# Patient Record
Sex: Male | Born: 1985 | Race: White | Hispanic: No | Marital: Single | Smoking: Never smoker
Health system: Southern US, Community
[De-identification: ages and names within clinical notes are randomized; demographics above are authoritative.]

---

## 2007-03-08 ENCOUNTER — Emergency Department (HOSPITAL_COMMUNITY): Admission: EM | Admit: 2007-03-08 | Discharge: 2007-03-09 | Payer: Self-pay | Admitting: Emergency Medicine

## 2007-03-10 ENCOUNTER — Ambulatory Visit (HOSPITAL_BASED_OUTPATIENT_CLINIC_OR_DEPARTMENT_OTHER): Admission: RE | Admit: 2007-03-10 | Discharge: 2007-03-10 | Payer: Self-pay | Admitting: Orthopedic Surgery

## 2007-04-07 ENCOUNTER — Emergency Department (HOSPITAL_COMMUNITY): Admission: EM | Admit: 2007-04-07 | Discharge: 2007-04-07 | Payer: Self-pay | Admitting: Emergency Medicine

## 2010-02-09 ENCOUNTER — Emergency Department (HOSPITAL_COMMUNITY): Admission: EM | Admit: 2010-02-09 | Discharge: 2010-02-09 | Payer: Self-pay | Admitting: Emergency Medicine

## 2010-07-16 IMAGING — CR DG SHOULDER 2+V*R*
3 series · 3 of 3 positions shown · non-contrast
Comparison: None available.

CLINICAL DATA: Shoulder injury and pain.

RIGHT SHOULDER - 2+ VIEW

[w shoulder ap internal righ]
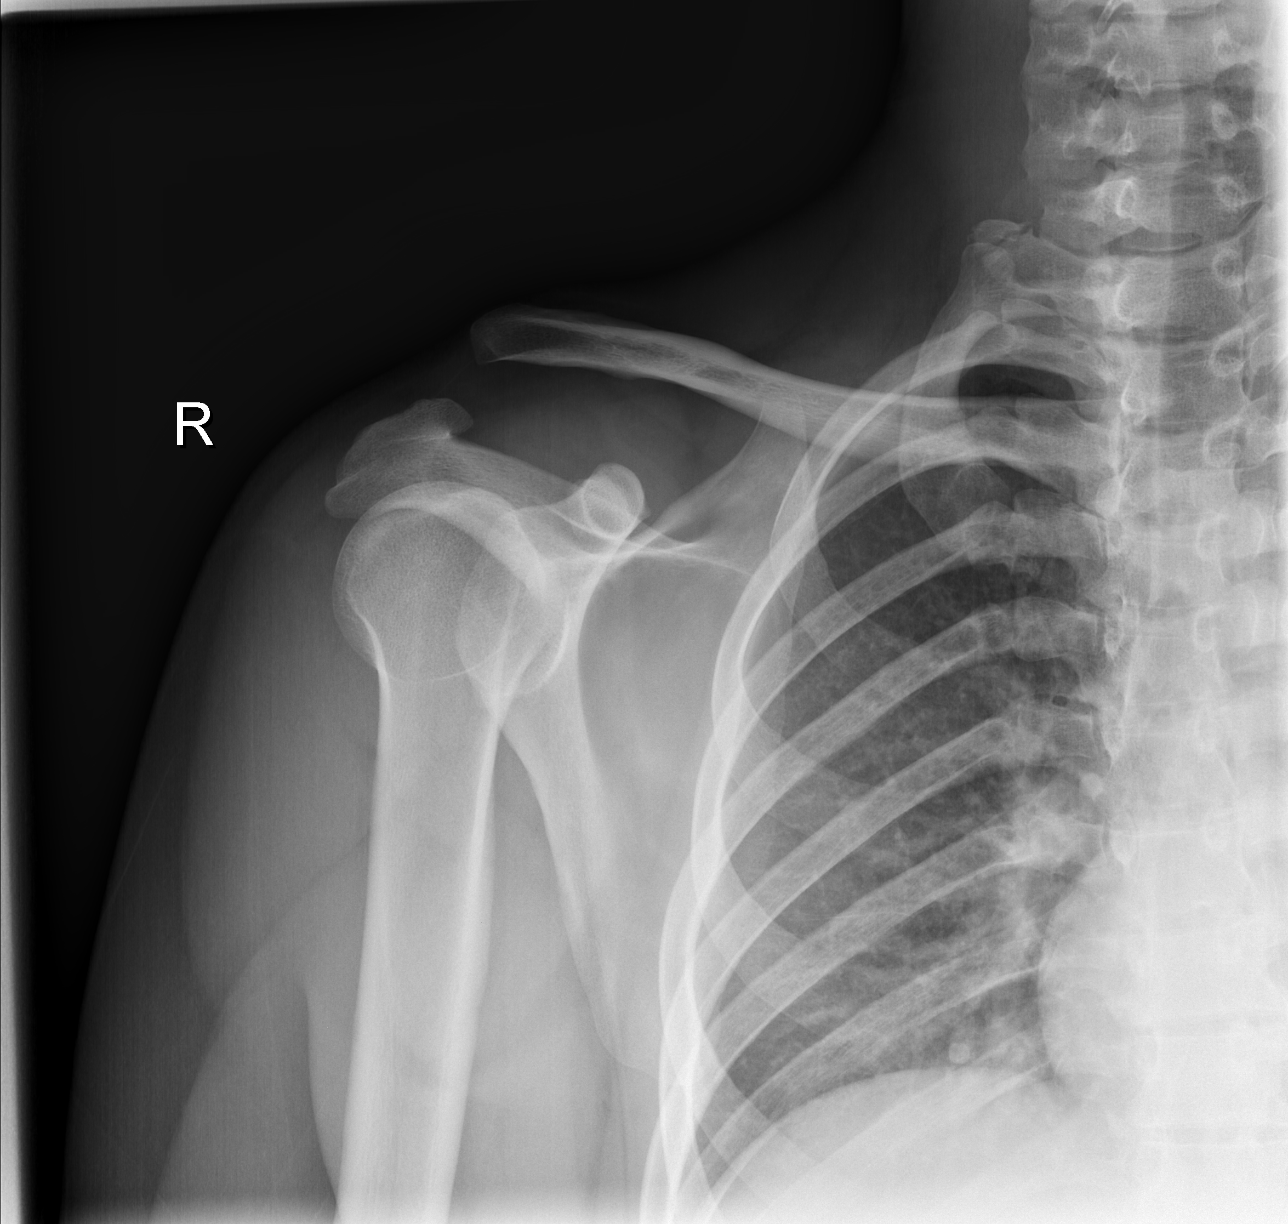

[w shoulder ap external righ]
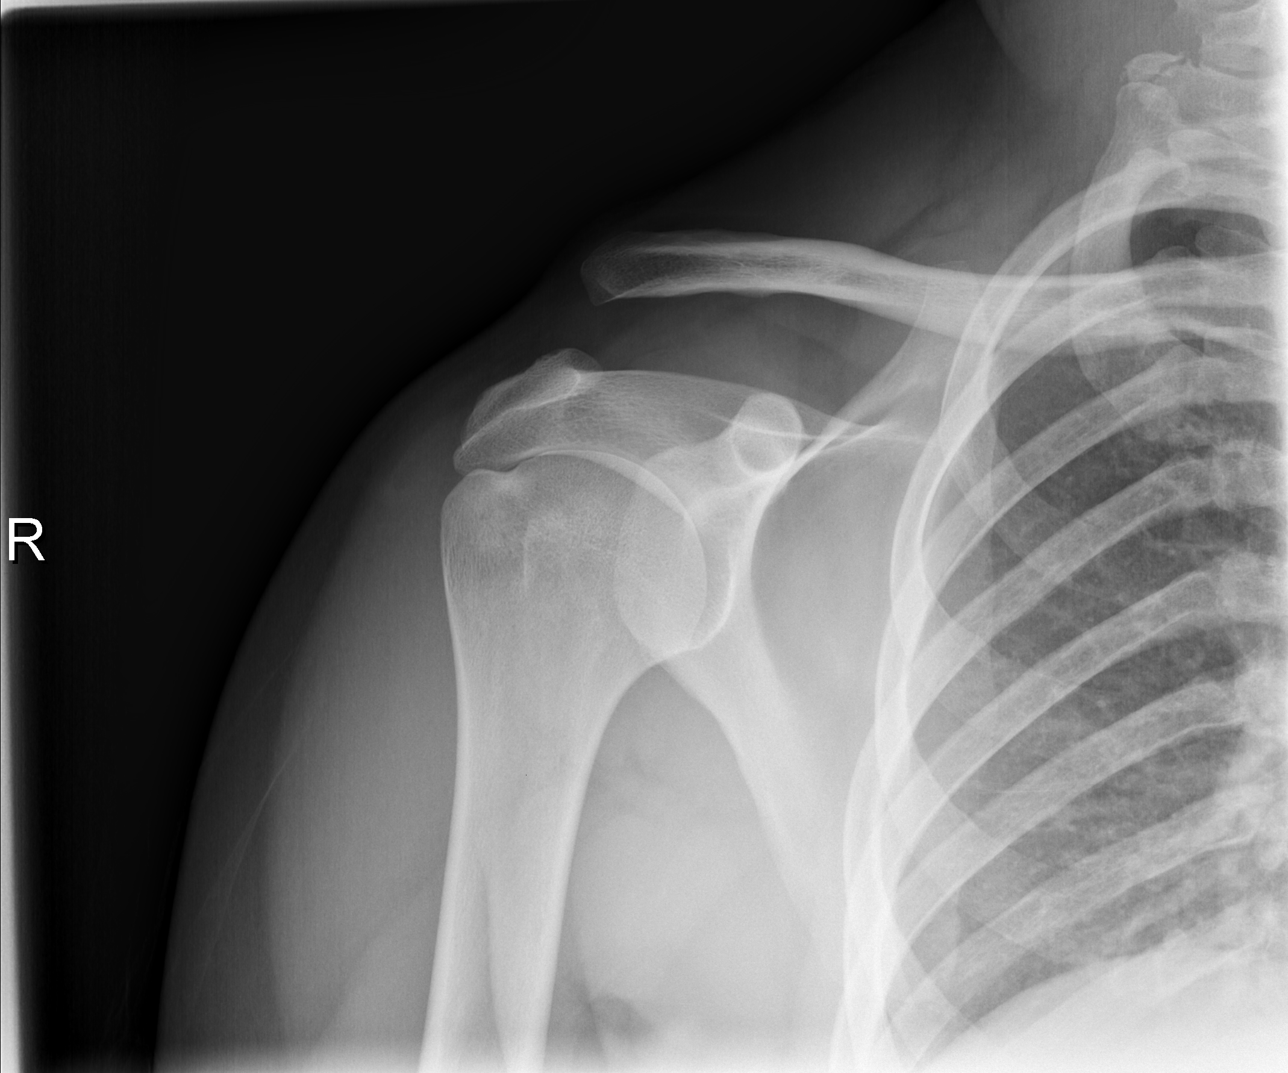

[w shoulder y view right]
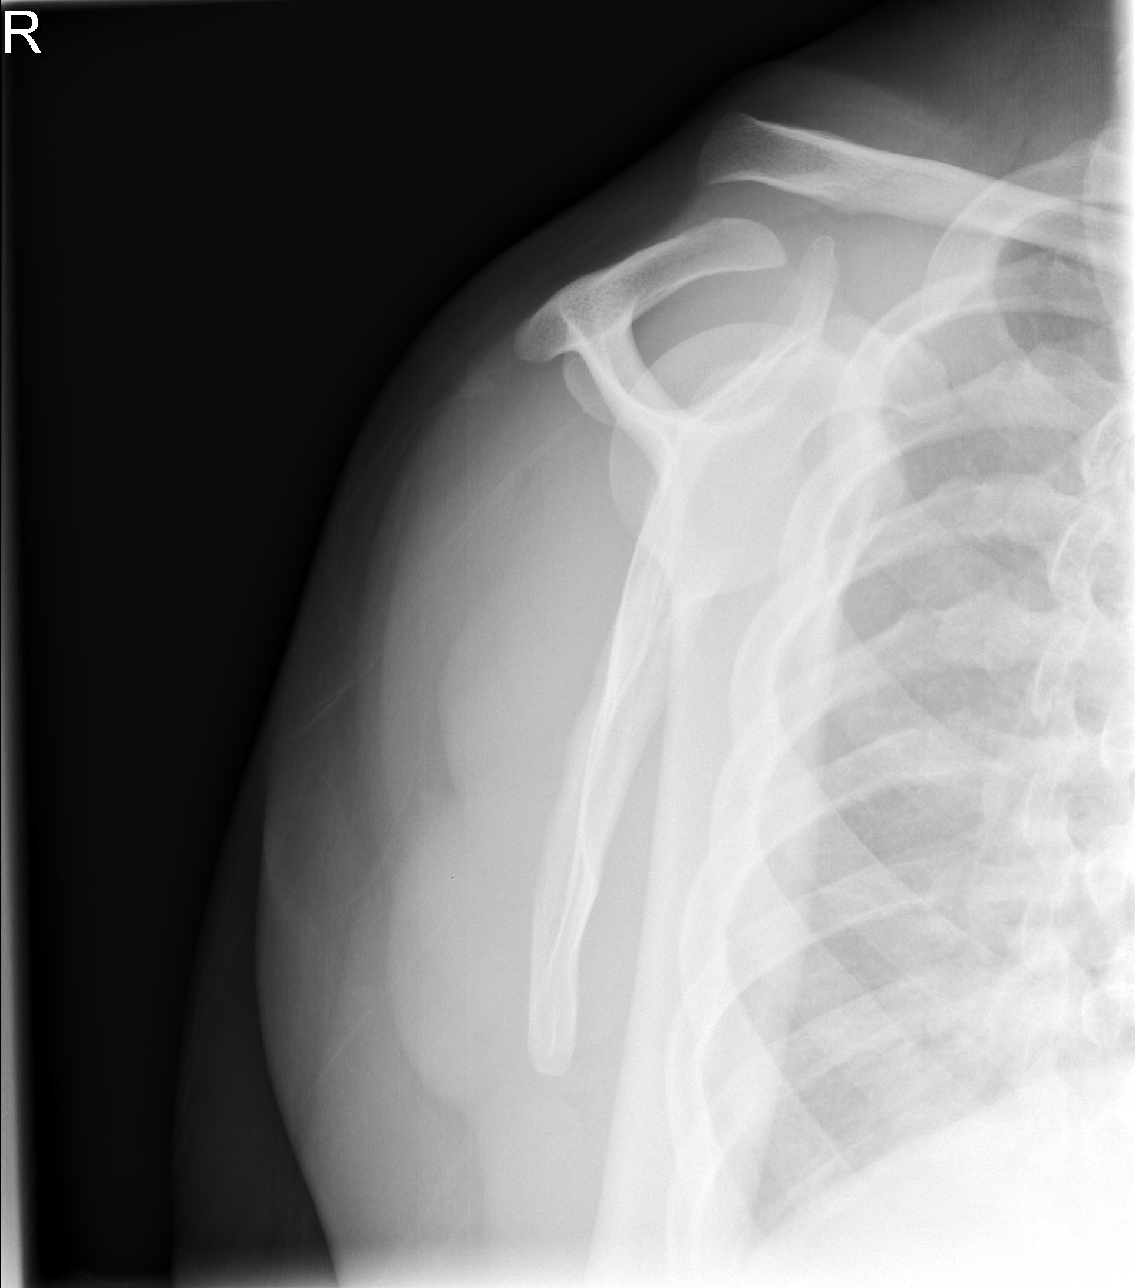

[3 of 3 positions shown; findings below may reference images not displayed]

FINDINGS: There is marked elevation of the distal clavicle relative
to the acromion.  The distal clavicle appears to lie in the
subcutaneous tissues. The humerus is located there is no fracture.
IMPRESSION: Acromioclavicular joint separation with marked elevation of the
distal clavicle compatible with grade 5 injury.

## 2011-05-09 NOTE — Op Note (Signed)
Casey Howard, Casey Howard               ACCOUNT NO.:  [id number]   MEDICAL RECORD NO.:  [id number]          PATIENT TYPE:  AMB   LOCATION:  DSC                          FACILITY:  [facility]   PHYSICIAN:  Artist Pais. Weingold, M.D.DATE OF BIRTH:  [birth year]   DATE OF PROCEDURE:  03/10/2007  DATE OF DISCHARGE:                               OPERATIVE REPORT   PREOPERATIVE DIAGNOSIS:  Traumatic skin avulsion, near tip amputation,  open distal phalangeal fracture, right long finger.   POSTOPERATIVE DIAGNOSIS:  Traumatic skin avulsion, near tip amputation,  open distal phalangeal fracture, right long finger.   PROCEDURE:  Exploration, repair, Irrigation, debridement, closed  treatment distal phalangeal fracture with nail bed repair.   SURGEON:  Artist Pais. Mina Marble, M.D.   ASSISTANT:  None.   ANESTHESIA:  General.   TOURNIQUET TIME:  18 minutes.   COMPLICATIONS:  None.   DRAINS:  None.   OPERATIVE REPORT:  The patient was taken to the operating suite. After  the induction of adequate general anesthesia, the right upper extremity  was prepped and draped in the usual sterile fashion.  The wound was then  thoroughly irrigated and debrided.  There was an avulsion injury to the  volar aspect of the long finger with the skin avulsed in the DIP flexion  crease distally along with a nail plate and nail bed laceration and a  small distal phalangeal fracture.  This was completely debrided of clot  and nonviable material.  The skin was then carefully and loosely sutured  using 4-0 nylon to reconstruct and maintain the integrity of the  eponychial fold and the palmar skin.  Once this was done, the fracture  was reduced.  The nail plate and nail bed was repaired with 6-0 undyed  Vicryl x6 sutures for a complex T-type laceration followed by placement  of a cleaned nail plate under the eponychial fold. The wound was then  dressed with Xeroform, 4x4s, and Coban wrap.  The patient tolerated the  procedures well and was taken to the recovery room in stable condition.      Artist Pais Mina Marble, M.D.  Electronically Signed    MAW/MEDQ  D:  03/10/2007  T:  03/10/2007  Job:  [id number]

## 2017-12-09 ENCOUNTER — Other Ambulatory Visit: Payer: Self-pay

## 2017-12-09 ENCOUNTER — Encounter: Payer: Self-pay | Admitting: *Deleted

## 2017-12-09 ENCOUNTER — Emergency Department (INDEPENDENT_AMBULATORY_CARE_PROVIDER_SITE_OTHER): Payer: PRIVATE HEALTH INSURANCE

## 2017-12-09 ENCOUNTER — Emergency Department (INDEPENDENT_AMBULATORY_CARE_PROVIDER_SITE_OTHER)
Admission: EM | Admit: 2017-12-09 | Discharge: 2017-12-09 | Disposition: A | Discharge status: Home / Self Care | Payer: PRIVATE HEALTH INSURANCE | Source: Home / Self Care | Attending: Family Medicine | Admitting: Family Medicine

## 2017-12-09 DIAGNOSIS — M25572 Pain in left ankle and joints of left foot: Secondary | ICD-10-CM | POA: Diagnosis not present

## 2017-12-09 DIAGNOSIS — S93602A Unspecified sprain of left foot, initial encounter: Secondary | ICD-10-CM | POA: Diagnosis not present

## 2017-12-09 NOTE — ED Provider Notes (Signed)
[facility]    CSN: [id number] Arrival date & time: 12/09/17  1951     History   Chief Complaint Chief Complaint  Patient presents with  . Ankle Pain    HPI Casey Howard is a 31 y.o. male.   Patient reports that while walking to his car this morning he lifted his left foot up and felt sudden pain in the dorsum of his foot and ankle.  He recalls no injury or change in activities.   The history is provided by the patient.  Foot Pain  This is a new problem. The current episode started 12 to 24 hours ago. The problem occurs constantly. The problem has not changed since onset.The symptoms are aggravated by walking. Nothing relieves the symptoms. Treatments tried: ice and elevation. The treatment provided no relief.    History reviewed. No pertinent past medical history.  There are no active problems to display for this patient.   History reviewed. No pertinent surgical history.     Home Medications    Prior to Admission medications   Medication Sig Start Date End Date Taking? Authorizing Provider  Ascorbic Acid (SUPER C COMPLEX PO) Take by mouth.   Yes [provider]    Family History Family History  Problem Relation Age of Onset  . Diabetes Father     Social History Social History   Tobacco Use  . Smoking status: Never Smoker  . Smokeless tobacco: Never Used  Substance Use Topics  . Alcohol use: Yes  . Drug use: No     Allergies   Patient has no known allergies.   Review of Systems Review of Systems  All other systems reviewed and are negative.    Physical Exam Triage Vital Signs ED Triage Vitals  Enc Vitals Group     BP 12/09/17 2011 137/84     Pulse Rate 12/09/17 2011 66     Resp 12/09/17 2011 16     Temp 12/09/17 2011 (!) 97.4 F (36.3 C)     Temp Source 12/09/17 2011 Oral     SpO2 12/09/17 2011 96 %     Weight 12/09/17 2013 265 lb (120.2 kg)     Height 12/09/17 2013 6' (1.829 m)     Head Circumference --     Peak Flow --      Pain Score 12/09/17 2013 8     Pain Loc --      Pain Edu? --      Excl. in GC? --    No data found.  Updated Vital Signs BP 137/84 (BP Location: Left Arm)   Pulse 66   Temp (!) 97.4 F (36.3 C) (Oral)   Resp 16   Ht 6' (1.829 m)   Wt 265 lb (120.2 kg)   SpO2 96%   BMI 35.94 kg/m   Visual Acuity Right Eye Distance:   Left Eye Distance:   Bilateral Distance:    Right Eye Near:   Left Eye Near:    Bilateral Near:     Physical Exam  Constitutional: He appears well-developed and well-nourished. No distress.  HENT:  Head: Atraumatic.  Eyes: Pupils are equal, round, and reactive to light.  Cardiovascular: Normal rate.  Pulmonary/Chest: Effort normal.  Musculoskeletal:       Left foot: There is decreased range of motion, tenderness, bony tenderness and swelling. There is normal capillary refill, no crepitus, no deformity and no laceration.       Feet:  Left foot  has distinct tenderness to palpation over dorsal mid-foot.  Pain is elicited by palpating extensor tendons during resisted dorsiflexion.  Neurological: He is alert.  Skin: Skin is warm and dry.  Nursing note and vitals reviewed.    UC Treatments / Results  Labs (all labs ordered are listed, but only abnormal results are displayed) Labs Reviewed - No data to display  EKG  EKG Interpretation None       Radiology Dg Ankle Complete Left  Result Date: 12/09/2017 CLINICAL DATA:  Left ankle pain EXAM: LEFT ANKLE COMPLETE - 3+ VIEW COMPARISON:  None. FINDINGS: There is no evidence of fracture, dislocation, or joint effusion. There is no evidence of arthropathy or other focal bone abnormality. Soft tissues are unremarkable. IMPRESSION: Negative. Electronically Signed   By: Charlett NoseKevin  Dover M.D.   On: 12/09/2017 20:22    Procedures Procedures (including critical care time)  Medications Ordered in UC Medications - No data to display   Initial Impression / Assessment and Plan / UC Course  I  have reviewed the triage vital signs and the nursing notes.  Pertinent labs & imaging results that were available during my care of the patient were reviewed by me and considered in my medical decision making (see chart for details).    Suspect strain of extensor tendons. Ace wrap applied. Apply ice pack for 30 minutes every 1 to 2 hours today and tomorrow.  Elevate.  Use crutches for 3 to 5 days.  Wear Ace wrap until swelling decreases. Take Ibuprofen 200mg , 4 tabs every 8 hours with food.  Begin range of motion and stretching exercises in about 5 days as per instruction sheet. Followup with Dr. Rodney Langtonhomas Thekkekandam or Dr. Clementeen GrahamEvan Corey ([facility]) if not improving about two weeks.      Final Clinical Impressions(s) / UC Diagnoses   Final diagnoses:  Sprain of left foot, initial encounter    ED Discharge Orders    None          Lattie HawBeese, Shyan Scalisi A, MD 12/10/17 2021

## 2017-12-09 NOTE — Discharge Instructions (Signed)
Apply ice pack for 30 minutes every 1 to 2 hours today and tomorrow.  Elevate.  Use crutches for 3 to 5 days.  Wear Ace wrap until swelling decreases. Take Ibuprofen 200mg , 4 tabs every 8 hours with food.  Begin range of motion and stretching exercises in about 5 days as per instruction sheet.

## 2017-12-09 NOTE — ED Triage Notes (Signed)
Pt c/o LT ankle pain x 1300 today. Denies injury. Took Tylenol PM at 1500.

## 2018-05-15 IMAGING — DX DG ANKLE COMPLETE 3+V*L*
3 series · 3 of 3 positions shown · non-contrast
Comparison: None.

CLINICAL DATA: Left ankle pain

EXAM:
LEFT ANKLE COMPLETE - 3+ VIEW

[ankle ap]
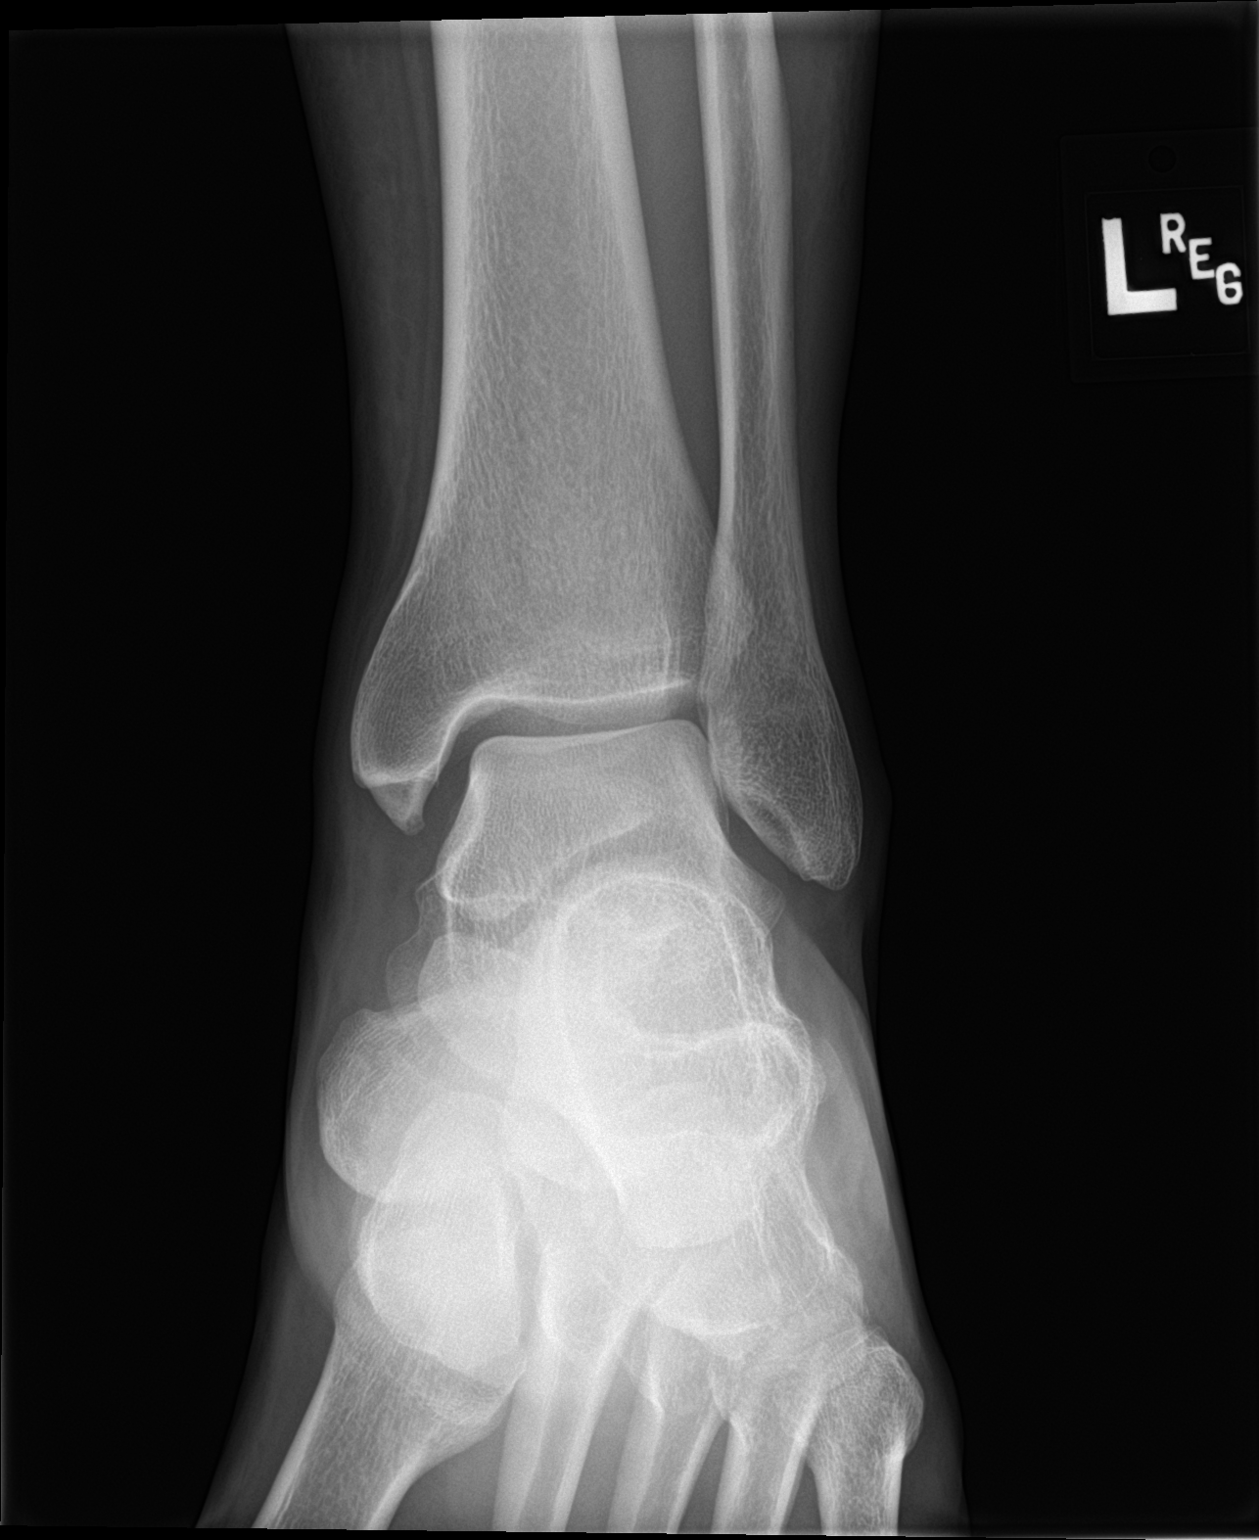

[ankle obl]
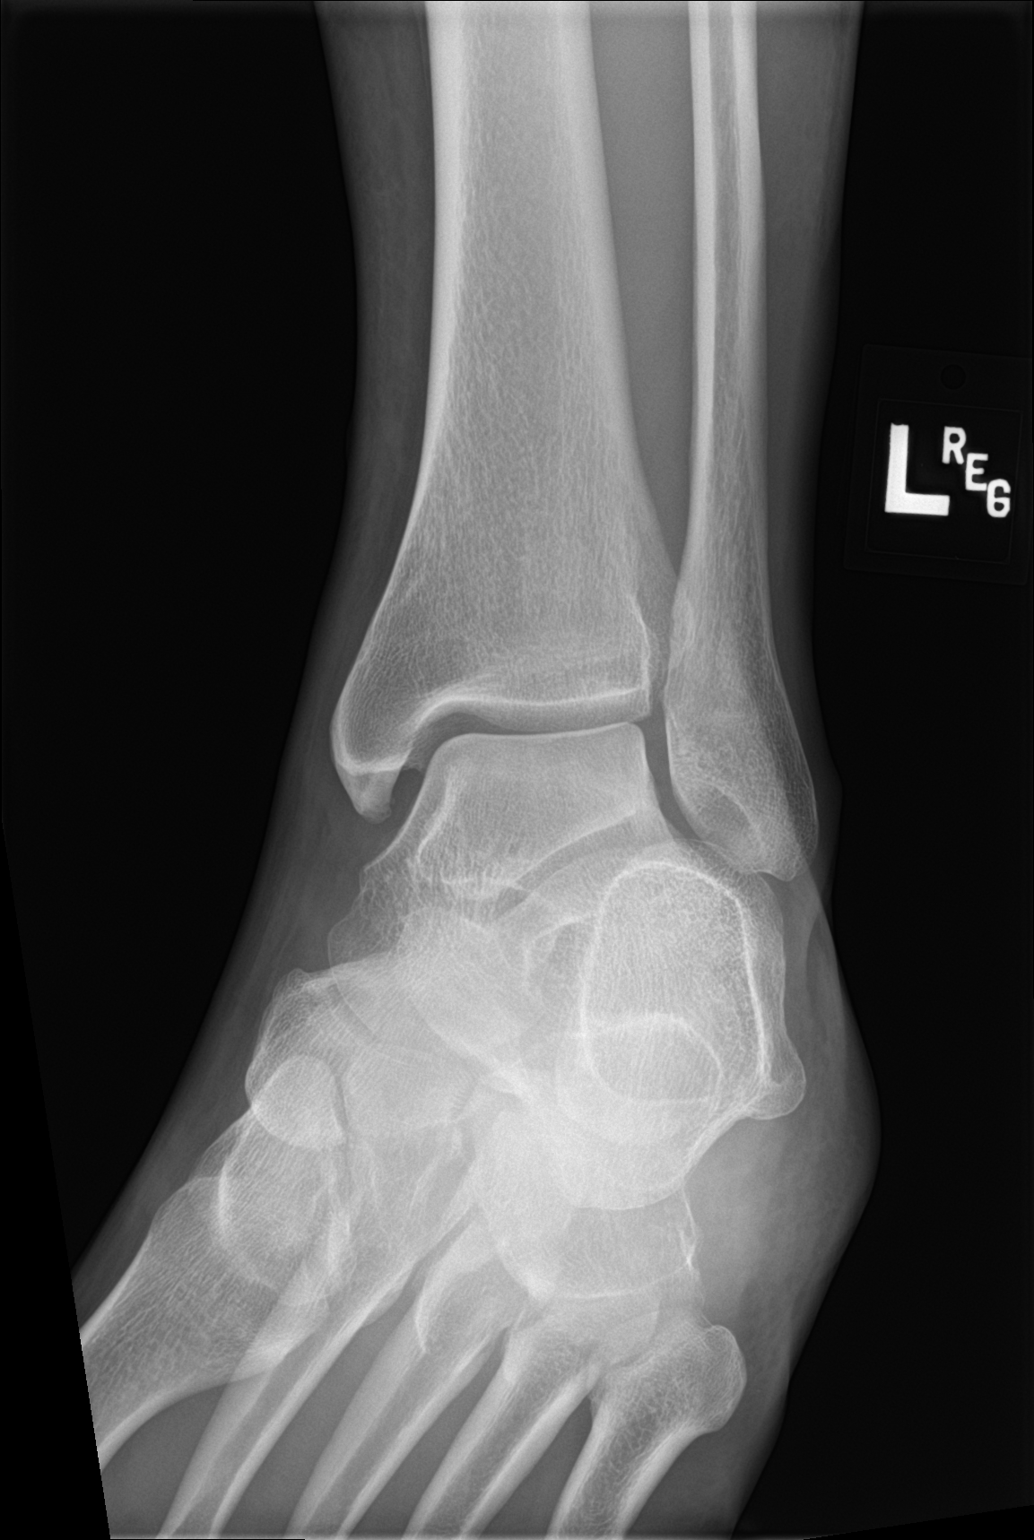

[ankle lat]
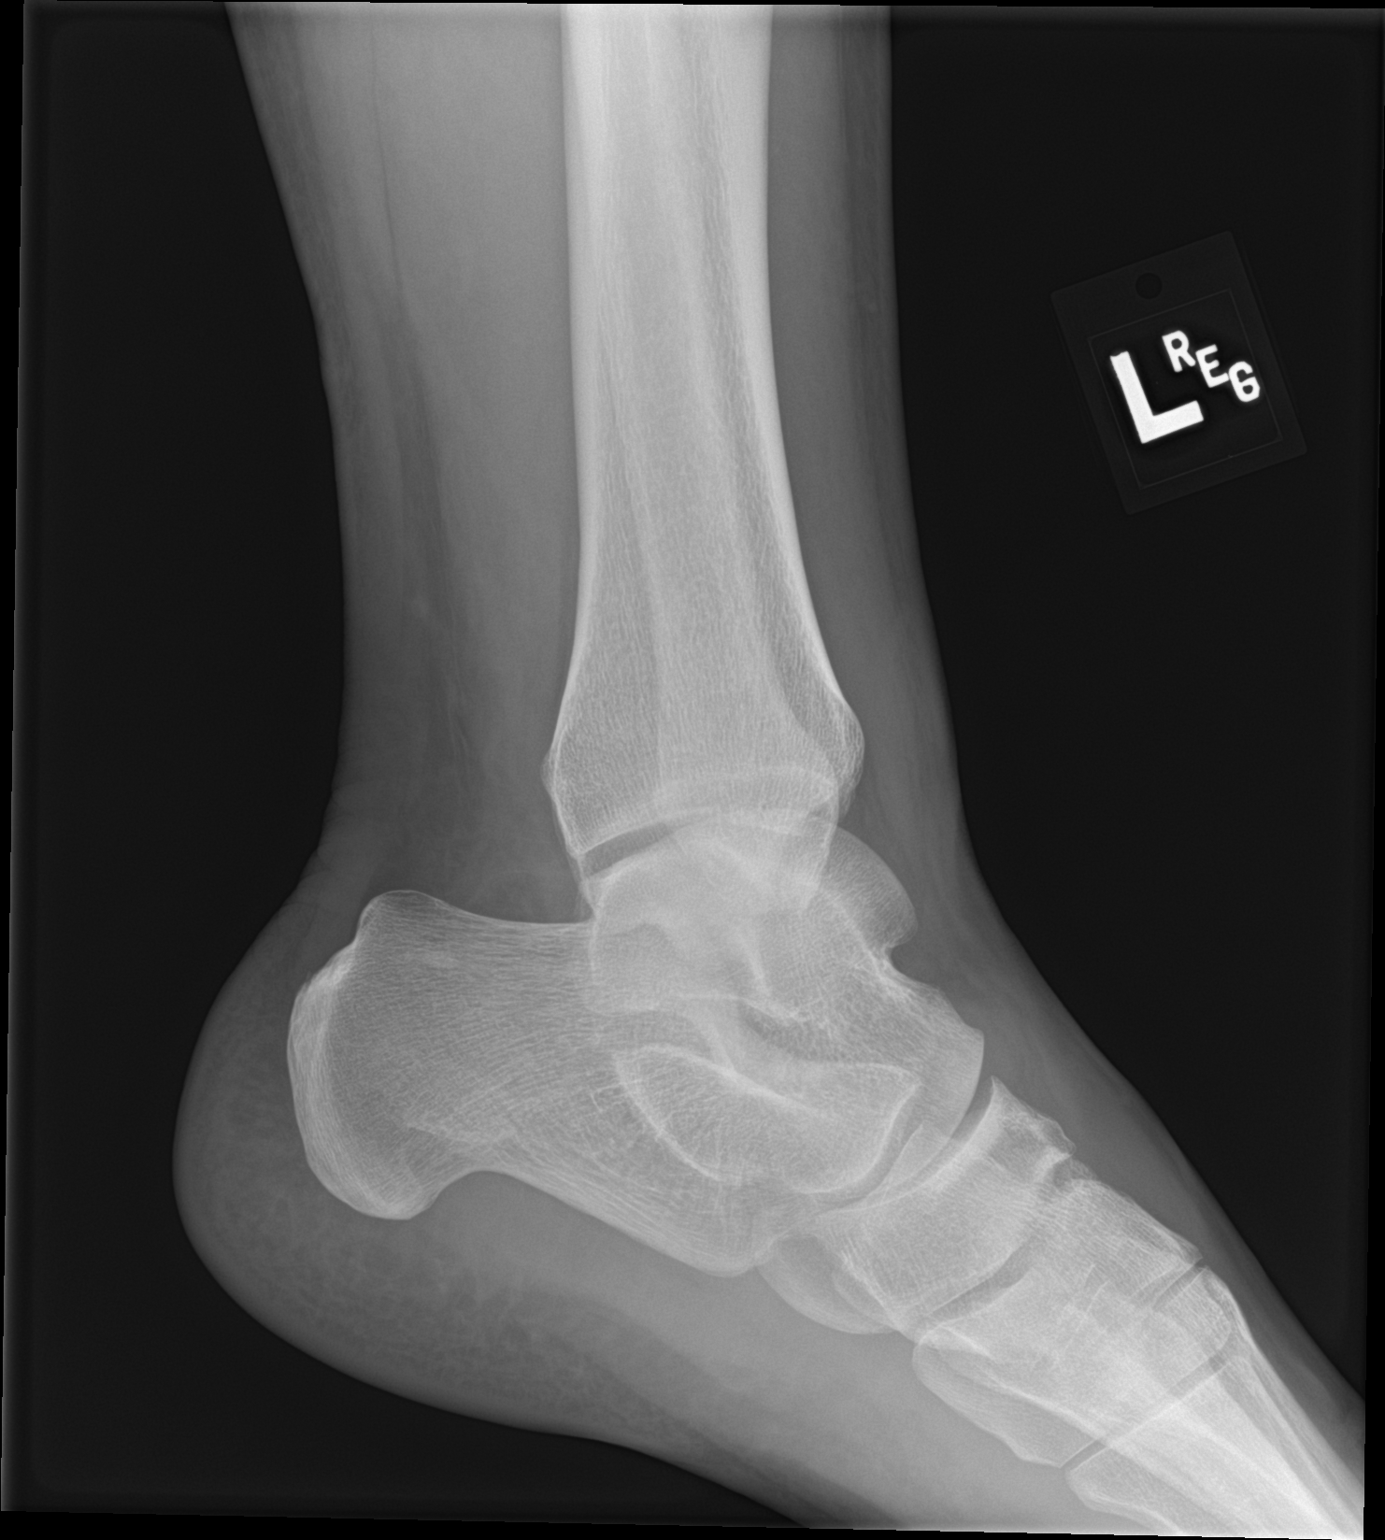

[3 of 3 positions shown; findings below may reference images not displayed]

FINDINGS: There is no evidence of fracture, dislocation, or joint effusion.
There is no evidence of arthropathy or other focal bone abnormality.
Soft tissues are unremarkable.
IMPRESSION: Negative.
